# Patient Record
Sex: Female | Born: 1960
Health system: Western US, Academic
[De-identification: ages and names within clinical notes are randomized; demographics above are authoritative.]

---

## 2020-08-22 ENCOUNTER — Ambulatory Visit: Payer: PRIVATE HEALTH INSURANCE

## 2020-08-22 DIAGNOSIS — F3340 Major depressive disorder, recurrent, in remission, unspecified: Secondary | ICD-10-CM

## 2020-08-22 DIAGNOSIS — G8929 Other chronic pain: Secondary | ICD-10-CM

## 2020-08-22 DIAGNOSIS — Z114 Encounter for screening for human immunodeficiency virus [HIV]: Secondary | ICD-10-CM

## 2020-08-22 DIAGNOSIS — L989 Disorder of the skin and subcutaneous tissue, unspecified: Secondary | ICD-10-CM

## 2020-08-22 DIAGNOSIS — Z1159 Encounter for screening for other viral diseases: Secondary | ICD-10-CM

## 2020-08-22 DIAGNOSIS — M5442 Lumbago with sciatica, left side: Secondary | ICD-10-CM

## 2020-08-22 MED ADMIN — LIDOCAINE-EPINEPHRINE 1 %-1:100000 IJ SOLN: 1 mL | INTRADERMAL | @ 18:00:00 | Stop: 2020-08-22 | NDC 63323048217

## 2020-08-22 NOTE — Addendum Note
Addended by: Cindie Laroche on: 08/22/2020 02:32 PM     Modules accepted: Orders

## 2020-08-22 NOTE — Progress Notes
PATIENT: Kelli Watts  MRN: [id number]  DOB: [birth year]  DATE OF SERVICE: 08/22/2020    CHIEF COMPLAINT:   Chief Complaint   Patient presents with   ??? Establish Care        HPI   Kelli Watts is a 59 y.o. female presents for   Chief Complaint   Patient presents with   ??? Establish Care     Several months of low back pain radiating down left leg into foot      MEDS     No outpatient medications have been marked as taking for the 08/22/20 encounter (Office Visit) with Jacqulyn Liner, DO.     Current Facility-Administered Medications for the 08/22/20 encounter (Office Visit) with Jacqulyn Liner, DO   Medication   ??? lidocaine-EPINEPHrine 1 %-1:100000 inj 1 mL       PHYSICAL EXAM      Last Recorded Vital Signs:    08/22/20 0931   BP: 123/76   Pulse: 65   Temp: 36.4 ???C (97.6 ???F)   SpO2: 97%     There is no height or weight on file to calculate BMI.    System Check if normal Positive or additional negative findings   Constit  [x]  General appearance     Eyes  [x]  Conj/Lids [x]  Pupils  [x]  Fundi     [name]  []  External ears/nose []  Otoscopy   [x]  Gross Hearing []  Nasal mucosa   []  Lips/teeth/gums []  Oropharynx    []  mucus membranes []  Head     Neck  []  Inspection/palpation []  Thyroid     Resp  [x]  Effort []  Wheezing    []  Auscultation  []  Crackles     CV  [x]  Rhythm/rate   []  Murmurs   []  LEE   []  JVP non-elevated    Normal pulses:   []  Radial []  Femoral  []  Pedal     Breast  []  Inspection []  Palpation     GI  []  abd masses    []  tenderness   []  rebound/guarding   []  Liver/spleen []  Rectal     GU  M: []  Scrotum []  Penis []  Prostate   F:  []  External []  vaginal wall        []  Cervix  []  mucus        []  Uterus    []  Adnexa      Lymph  []  Neck []  Axillae []  Groin     MSK Specify site examined:    []  Inspect/palp []  ROM   []  Stability [x]  Strength/tone         Skin  []  Inspection []  Palpation  3mm papule left shoulder   Neuro  [x]  CN2-12 intact grossly   [x]  Alert and oriented   []  DTR      [x]  Muscle strength      []  Sensation   [x]  Gait/balance     Psych  [x]  Insight/judgement     [x]  Mood/affect    [x]  Gross cognition        LABS/STUDIES   I have:   []  Reviewed/ordered []  1 []  2 []  ? 3 unique laboratory, radiology, and/or diagnostic tests noted below    []  Reviewed []  1 []  2 []  ? 3 prior external notes and incorporated into patient assessment    []  Discussed management or test interpretation with external provider(s) as noted       Lab Studies:  No results found for any previous visit.  2018 ACC/AHA guidelines recommends that patient is not in statin benefit group. Encourage adherence to heart-healthy lifestyle.    10-Year ASCVD risk cannot be calculated because at least one required variable is not available in [facility] as of 10:08 AM on 08/22/2020.  10-Year ASCVD risk with optimal risk factors is 1.6%.  Values used to calculate ASCVD score:  Age: 59 y.o.   Gender: Female Race: White or Caucasian  Cannot calculate risk because HDL cholesterol not documented within the past 5 years.    Cannot calculate risk because Total cholesterol not documented within the past 5 years.    LDL cholesterol not documented within the past 5 years.    Systolic BP: 123 mm Hg. BP was measured on 08/22/2020.  The patient is not being treated with a medication that influences SBP.  The patient is currently not a smoker.  The patient does not have a diagnosis of diabetes.  Click here for the [facility] ASCVD Cardiovascular Risk Estimator Plus tool Office manager).    Imaging Studies:   No new imaging reviewed today    A&P   Kelli Watts is a 59 y.o. female presenting for   Chief Complaint   Patient presents with   ??? Establish Care         SHAVE BIOPSY PROCEDURE NOTE:    Risk and benefits of Shave Biopsy explained to patient's satisfaction. All questions answered. Informed consent obtained.    Area was cleaned with alcohol. 1 mL of 1% Lidocaine with Epinephrine was injected subcutaneous at biopsy site raising lesion. Dermablade used to shave surface of lesion from left shoulder. Biopsy sent to Pathology. Hemostasis achieved with pressure and Aluminum Hydroxide solution.  Antibiotic ointment and bandage applied. No complications noted.    Patient tolerated procedure well. Wound care instruction provided. Return precautions given. Patient to seek medical attention if signs of infection/pain/concerns. Will call patient with results of Pathology.        PROBLEM & ORDERS    ICD-10-CM    1. Chronic left-sided low back pain with left-sided sciatica  M54.42 XR lumbar spine ap+lat+obl+L5-S1 (5 views)    G89.29 Referral to Primary Care, Sports Medicine   2. Screening for HIV (human immunodeficiency virus)  Z11.4 HIV-1/2 Ag/Ab 4th Generation with Reflex Confirmation   3. Need for hepatitis C screening test  Z11.59 HCV Antibody Screen with Reflex to Quantitative PCR/Genotype   4. MDD (recurrent major depressive disorder) in remission (HCC/RAF)  F33.40 CBC & Auto Differential     Comprehensive Metabolic Panel     Lipid Panel     Sedimentation Rate, Erythrocyte     hsCRP (Cardio CRP) and CV Risk     Vitamin D,25-Hydroxy     TSH with reflex FT4, FT3     Hgb A1c     Urinalysis,Routine     Total Protein/Creat Ratio     Homocysteine, Total   5. Skin lesion  L98.9 Tissue Exam     lidocaine-EPINEPHrine 1 %-1:100000 inj 1 mL       ASSESSMENT      #skin lesion  -suspect SEBORRHEIC KERATOSIS     #LOW BACK PAIN  -PHYSICAL THERAPY  -Xray  -sports med for possible CORTICOSTEROID INJECTION  -follow up OMT    #health maintenance   -schedule shingrix around training schedule     The above recommendation were discussed with the patient.  The patient has all questions answered satisfactorily and is in agreement with this recommended plan of care.  No follow-ups on file.     Author:  Jacqulyn Liner 08/22/2020 10:08 AM

## 2020-08-24 LAB — Tissue Exam

## 2020-09-19 ENCOUNTER — Ambulatory Visit: Payer: BLUE CROSS/BLUE SHIELD

## 2020-10-06 ENCOUNTER — Telehealth: Payer: BLUE CROSS/BLUE SHIELD

## 2020-10-06 NOTE — Telephone Encounter
Called and lvm to advice to get labs done or r/s with PCP, need lab results.

## 2020-10-07 ENCOUNTER — Ambulatory Visit: Payer: PRIVATE HEALTH INSURANCE

## 2020-10-07 ENCOUNTER — Telehealth: Payer: BLUE CROSS/BLUE SHIELD

## 2020-10-07 DIAGNOSIS — G8929 Other chronic pain: Secondary | ICD-10-CM

## 2020-10-07 DIAGNOSIS — M5442 Lumbago with sciatica, left side: Secondary | ICD-10-CM

## 2020-10-07 NOTE — Progress Notes
PATIENT: Kelli Watts  MRN: [id number]  DOB: Feb 16, 1961  DATE OF SERVICE: 10/07/2020    CHIEF COMPLAINT:   Chief Complaint   Patient presents with   ??? Results     lab and xray results    ??? Referral / Auth     physical therapy        HPI   Kelli Watts is a 60 y.o. female presents for   Chief Complaint   Patient presents with   ??? Results     lab and xray results    ??? Referral / Auth     physical therapy         MEDS     No outpatient medications have been marked as taking for the 10/07/20 encounter (Office Visit) with Jacqulyn Liner, DO.       PHYSICAL EXAM      Last Recorded Vital Signs:    10/07/20 1353   BP: 112/72   Pulse: 59   Resp: 18   Temp: 37 ???C (98.6 ???F)   SpO2: 98%     Body mass index is 23.74 kg/m???.    System Check if normal Positive or additional negative findings   Constit  [x]  General appearance     Eyes  [x]  Conj/Lids [x]  Pupils  [x]  Fundi     [name]  []  External ears/nose []  Otoscopy   [x]  Gross Hearing []  Nasal mucosa   []  Lips/teeth/gums []  Oropharynx    []  mucus membranes []  Head     Neck  []  Inspection/palpation []  Thyroid     Resp  [x]  Effort []  Wheezing    []  Auscultation  []  Crackles     CV  [x]  Rhythm/rate   []  Murmurs   []  LEE   []  JVP non-elevated    Normal pulses:   []  Radial []  Femoral  []  Pedal     Breast  []  Inspection []  Palpation     GI  []  abd masses    []  tenderness   []  rebound/guarding   []  Liver/spleen []  Rectal     GU  M: []  Scrotum []  Penis []  Prostate   F:  []  External []  vaginal wall        []  Cervix  []  mucus        []  Uterus    []  Adnexa      Lymph  []  Neck []  Axillae []  Groin     MSK Specify site examined:    []  Inspect/palp []  ROM   []  Stability [x]  Strength/tone         Skin  []  Inspection []  Palpation     Neuro  [x]  CN2-12 intact grossly   [x]  Alert and oriented   []  DTR      [x]  Muscle strength      []  Sensation   [x]  Gait/balance     Psych  [x]  Insight/judgement     [x]  Mood/affect    [x]  Gross cognition        LABS/STUDIES   I have:   []  Reviewed/ordered []  1 []  2 []  ? 3 unique laboratory, radiology, and/or diagnostic tests noted below    []  Reviewed []  1 []  2 []  ? 3 prior external notes and incorporated into patient assessment    []  Discussed management or test interpretation with external provider(s) as noted       Lab Studies:  Orders Only on 09/19/2020   Component Date Value Ref Range Status   ???  Homocysteine, Cardiovascular (Ques* 09/19/2020 8.7  <10.4 umol/L Final    Comment: Homocysteine is increased by functional deficiency of   folate or vitamin B12. Testing for methylmalonic acid   differentiates between these deficiencies. Other causes   of increased homocysteine include renal failure, folate   antagonists such as methotrexate and phenytoin, and   exposure to nitrous oxide.  Jeani Sow, et al., Ann Intern Med. 1999;131(5):331-9.     ??? Creatinine, Random Urine ([facility]) 09/19/2020 55  20 - 275 mg/dL Final   ??? Protein/Creatinine Ratio ([facility]) 09/19/2020 109  21 - 161 mg/g creat Final   ??? Protein/Creatinine Ratio ([facility]) 09/19/2020 0.109  0.021 - 0.161 mg/mg creat Final   ??? Protein, Total, Random Ur ([facility]) 09/19/2020 6  5 - 24 mg/dL Final        Collected 08/22/2020 14:32 ???   Status: Final result ???   Visible to patient: No (not released) ???   Dx: Skin lesion ???   0 Result Notes    Component    CASE REPORT   Surgical Pathology Report ??? ??? ??? ??? ??? ??? ??? ??? ??? ??? ??? ??? Case: [id number] ??? ??? ??? ??? ??? ??? ??? ??? ??? ??? ??? ??? ??? ??? ???   Authorizing Provider: ???Barnetta Chapel, Andrik Sandt, DO ??? ??? ??? ??? ???Collected: ??? ??? ??? ??? ??? 08/22/2020 1432 ??? ??? ??? ??? ??? ???   Ordering Location: ??? ??? [facility] ??? ??? ??? ??? Received: ??? ??? ??? ??? ??? ???08/23/2020 0017 ??? ??? ??? ??? ??? ???   ?????? ??? ??? ??? ??? ??? ??? ??? ??? ??? ??? Medicine Clarene Reamer ??? ??? ??? ??? ??? ??? ??? ??? ??? ??? ??? ??? ??? ??? ??? ??? ??? ??? ??? ??? ??? ??? ??? ??? ??? ??? ??? ??? ???    Pathologist: ??? ??? ??? ??? ??? Sarantopoulos, George P., ??? ??? ??? ??? ??? ??? ??? ??? ??? ??? ??? ??? ??? ??? ??? ??? ??? ??? ??? ??? ??? ??? ??? ??? ???   ?????? ??? ??? ??? ??? ??? ??? ??? ??? ??? ??? MD ??? ??? ??? ??? ??? ??? ??? ??? ??? ??? ??? ??? ??? ??? ??? ??? ??? ??? ??? ??? ??? ??? ??? ??? ??? ??? ??? ??? ??? ??? ??? ??? ??? ??? ??? ???    Specimen: ??? ???Skin, Left shoulder ??? ??? ??? ??? ??? ??? ??? ??? ??? ??? ??? ??? ??? ??? ??? ??? ??? ??? ??? ??? ??? ??? ??? ??? ??? ??? ??? ??? ??? ??? ??? ??? ???      CLINICAL INFORMATION    left shoulder 3mm lesion   FINAL DIAGNOSIS       SKIN, LEFT SHOULDER (SHAVE BIOPSY):  - Seborrheic keratosis, inflamed   - No malignancy in the sections examined             2018 ACC/AHA guidelines recommends that patient is not in statin benefit group. Encourage adherence to heart-healthy lifestyle.    10-Year ASCVD risk cannot be calculated because at least one required variable is not available in [facility] as of 2:28 PM on 10/07/2020.  10-Year ASCVD risk with optimal risk factors is 1.6%.  Values used to calculate ASCVD score:  Age: 60 y.o.   Gender: Female Race: White or Caucasian  Cannot calculate risk because HDL cholesterol not documented within the past 5 years.    Cannot calculate risk because Total cholesterol not documented within the past 5 years.    LDL cholesterol  not documented within the past 5 years.    Systolic BP: 112 mm Hg. BP was measured on 10/07/2020.  The patient is not being treated with a medication that influences SBP.  The patient is currently not a smoker.  The patient does not have a diagnosis of diabetes.  Click here for the [facility] ASCVD Cardiovascular Risk Estimator Plus tool Office manager).    Imaging Studies:       Narrative & Impression   XR LUMBAR SPINE AP LAT OBL 4V  ???  INDICATION: ''chronic low back pain with left sided sciatica''  ???  COMPARISON: None.  ???  ???  IMPRESSION:  ???  There is moderate scoliosis of the mid lumbar spine.  There is no compression fracture or subluxation.  The disc spaces overall well preserved.  There is moderate lower lumbar facet arthropathy.  The sacroiliac joints are unremarkable.  ???  Signed by: Evangeline Gula   08/25/2020 2:56 PM         A&P   Kelli Watts is a 59 y.o. female presenting for   Chief Complaint   Patient presents with   ??? Results     lab and xray results    ??? Referral / Auth     physical therapy         PROBLEM & ORDERS    ICD-10-CM    1. Chronic left-sided low back pain with left-sided sciatica  M54.42 Referral to Rehabilitation, Physical Therapy BPQI (Back Pain)    G89.29 Referral to Medicine, [place]       ASSESSMENT  #hyperlipidemia (elevated cholesterol)   Outside lab work showing LDL in the 130s HDL 65.  Patient concerned because she is exercising with high-intensity and eating very well.  Explained in the context overall good health, isolated LDL moderate elevation is of minimal concern and does not necessitate any further action  ???  #LOW BACK PAIN  -PHYSICAL THERAPY  -Xray  -sports med for possible CORTICOSTEROID INJECTION  -follow up OMT  ???  #skin lesion  -confirmed SEBORRHEIC KERATOSIS     #health maintenance   -schedule shingrix around training schedule     The above recommendation were discussed with the patient.  The patient has all questions answered satisfactorily and is in agreement with this recommended plan of care.    No follow-ups on file.     Author:  Jacqulyn Liner 10/07/2020 2:28 PM

## 2020-10-07 NOTE — Telephone Encounter
Pt is an HMO, Dr. Barnetta Chapel submitted 2 referrals. I advised you would call with further information, Doctor was not specific w/ locations and referral still needs to be processed. Thank you.

## 2020-10-10 ENCOUNTER — Ambulatory Visit: Payer: PRIVATE HEALTH INSURANCE

## 2020-10-10 NOTE — Telephone Encounter
Forwarded by: Ezzard Flax, can you please schedule patient? Thank you

## 2020-10-11 ENCOUNTER — Ambulatory Visit: Payer: PRIVATE HEALTH INSURANCE

## 2020-10-12 ENCOUNTER — Telehealth: Payer: PRIVATE HEALTH INSURANCE

## 2020-10-12 DIAGNOSIS — M5442 Lumbago with sciatica, left side: Secondary | ICD-10-CM

## 2020-10-12 DIAGNOSIS — G8929 Other chronic pain: Secondary | ICD-10-CM

## 2020-10-12 DIAGNOSIS — F419 Anxiety disorder, unspecified: Secondary | ICD-10-CM

## 2020-10-12 DIAGNOSIS — F322 Major depressive disorder, single episode, severe without psychotic features: Secondary | ICD-10-CM

## 2020-10-12 DIAGNOSIS — E782 Mixed hyperlipidemia: Secondary | ICD-10-CM

## 2020-10-12 NOTE — Progress Notes
[facility] PATIENT: Kelli Watts  MRN: [id number]  DOB: Jan 24, 1961  DATE OF SERVICE: 10/12/2020    CHIEF COMPLAINT: No chief complaint on file.       HPI   Kelli Watts is a 60 y.o. female presents for No chief complaint on file.      Severe depression  No SI or plan   Has plan Kelli Watts        MEDS     No outpatient medications have been marked as taking for the 10/12/20 encounter (Telemedicine) with Jacqulyn Liner, DO.       PHYSICAL EXAM    There were no vitals filed for this visit.  There is no height or weight on file to calculate BMI.    System Check if normal Positive or additional negative findings   Constit  [x]  General appearance     Eyes  [x]  Conj/Lids [x]  Pupils  [x]  Fundi     [name]  []  External ears/nose []  Otoscopy   [x]  Gross Hearing []  Nasal mucosa   []  Lips/teeth/gums []  Oropharynx    []  mucus membranes []  Head     Neck  []  Inspection/palpation []  Thyroid     Resp  [x]  Effort []  Wheezing    []  Auscultation  []  Crackles     CV  [x]  Rhythm/rate   []  Murmurs   []  LEE   []  JVP non-elevated    Normal pulses:   []  Radial []  Femoral  []  Pedal     Breast  []  Inspection []  Palpation     GI  []  abd masses    []  tenderness   []  rebound/guarding   []  Liver/spleen []  Rectal     GU  M: []  Scrotum []  Penis []  Prostate   F:  []  External []  vaginal wall        []  Cervix  []  mucus        []  Uterus    []  Adnexa      Lymph  []  Neck []  Axillae []  Groin     MSK Specify site examined:    []  Inspect/palp []  ROM   []  Stability [x]  Strength/tone         Skin  []  Inspection []  Palpation     Neuro  [x]  CN2-12 intact grossly   [x]  Alert and oriented   []  DTR      [x]  Muscle strength      []  Sensation   [x]  Gait/balance     Psych  [x]  Insight/judgement     [x]  Mood/affect    [x]  Gross cognition        LABS/STUDIES   I have:   []  Reviewed/ordered []  1 []  2 []  ? 3 unique laboratory, radiology, and/or diagnostic tests noted below    []  Reviewed []  1 []  2 []  ? 3 prior external notes and incorporated into patient assessment    []  Discussed management or test interpretation with external provider(s) as noted       Lab Studies:  Orders Only on 09/19/2020   Component Date Value Ref Range Status   ??? Homocysteine, Cardiovascular (Ques* 09/19/2020 8.7  <10.4 umol/L Final    Comment: Homocysteine is increased by functional deficiency of   folate or vitamin B12. Testing for methylmalonic acid   differentiates between these deficiencies. Other causes   of increased homocysteine include renal failure, folate   antagonists such as methotrexate and phenytoin, and   exposure to nitrous oxide.  Selhub J, et al.,  Ann Intern Med. 1999;131(5):331-9.     ??? Creatinine, Random Urine ([facility]) 09/19/2020 55  20 - 275 mg/dL Final   ??? Protein/Creatinine Ratio ([facility]) 09/19/2020 109  21 - 161 mg/g creat Final   ??? Protein/Creatinine Ratio ([facility]) 09/19/2020 0.109  0.021 - 0.161 mg/mg creat Final   ??? Protein, Total, Random Ur ([facility]) 09/19/2020 6  5 - 24 mg/dL Final       0981 ACC/AHA guidelines recommends that patient is not in statin benefit group. Encourage adherence to heart-healthy lifestyle.    10-Year ASCVD risk cannot be calculated because at least one required variable is not available in [facility] as of 11:45 AM on 10/12/2020.  10-Year ASCVD risk with optimal risk factors is 1.6%.  Values used to calculate ASCVD score:  Age: 60 y.o.   Gender: Female Race: White or Caucasian  Cannot calculate risk because HDL cholesterol not documented within the past 5 years.    Cannot calculate risk because Total cholesterol not documented within the past 5 years.    LDL cholesterol not documented within the past 5 years.    Systolic BP: 112 mm Hg. BP was measured on 10/07/2020.  The patient is not being treated with a medication that influences SBP.  The patient is currently not a smoker.  The patient does not have a diagnosis of diabetes.  Click here for the [facility] ASCVD Cardiovascular Risk Estimator Plus tool Office manager).    Imaging Studies:   No new imaging reviewed today    A&P   Kelli Watts is a 60 y.o. female presenting for No chief complaint on file.        PROBLEM & ORDERS    ICD-10-CM    1. Anxiety  F41.9 Referral to Psych [facility])   2. Severe depression (HCC/RAF)  F32.2 Referral to Psych [facility])   3. Chronic left-sided low back pain with left-sided sciatica  M54.42     G89.29        ASSESSMENT      #anxiety/depression  -start counseling  -trial 5-htp  -reach out to friend for support  -follow up 4 weeks    #low back pain  -physical therapy referral  Pending  -follow up with me for OMT     #hyperlipidemia (elevated cholesterol)  -justifies further work up including inside tracker      The above recommendation were discussed with the patient.  The patient has all questions answered satisfactorily and is in agreement with this recommended plan of care.    Return in about 4 weeks (around 11/09/2020).     Author:  Jacqulyn Liner 10/12/2020 11:45 AM

## 2020-10-12 NOTE — Progress Notes
Pt scheduled  

## 2020-10-14 NOTE — Telephone Encounter
Hi [facility], can patient PT referral be process so that she can be scheduled? Thank you      Verlon Au

## 2020-10-18 ENCOUNTER — Ambulatory Visit: Payer: BLUE CROSS/BLUE SHIELD

## 2020-10-23 ENCOUNTER — Ambulatory Visit: Payer: PRIVATE HEALTH INSURANCE

## 2020-10-24 DIAGNOSIS — M9900 Segmental and somatic dysfunction of head region: Secondary | ICD-10-CM

## 2020-10-24 DIAGNOSIS — M9902 Segmental and somatic dysfunction of thoracic region: Secondary | ICD-10-CM

## 2020-10-24 DIAGNOSIS — M9907 Segmental and somatic dysfunction of upper extremity: Secondary | ICD-10-CM

## 2020-10-24 DIAGNOSIS — M9908 Segmental and somatic dysfunction of rib cage: Secondary | ICD-10-CM

## 2020-10-24 DIAGNOSIS — F322 Major depressive disorder, single episode, severe without psychotic features: Secondary | ICD-10-CM

## 2020-10-24 DIAGNOSIS — M9905 Segmental and somatic dysfunction of pelvic region: Secondary | ICD-10-CM

## 2020-10-24 DIAGNOSIS — M9906 Segmental and somatic dysfunction of lower extremity: Secondary | ICD-10-CM

## 2020-10-24 DIAGNOSIS — M9901 Segmental and somatic dysfunction of cervical region: Secondary | ICD-10-CM

## 2020-10-24 DIAGNOSIS — M9904 Segmental and somatic dysfunction of sacral region: Secondary | ICD-10-CM

## 2020-10-24 NOTE — Progress Notes
PATIENT: Kelli Watts  MRN: [id number]  DOB: [birth year]  DATE OF SERVICE: 10/18/2020    CHIEF COMPLAINT: No chief complaint on file.       HPI   Kelli Watts is a 60 y.o. female presents for No chief complaint on file.      MEDS     No outpatient medications have been marked as taking for the 10/18/20 encounter (Office Visit) with Jacqulyn Liner, DO.       PHYSICAL EXAM      Last Recorded Vital Signs:    10/18/20 1431   BP: 125/75   Pulse: 55   Resp: 16   Temp: 36.9 ???C (98.4 ???F)   SpO2: 98%     Body mass index is 23.67 kg/m???.    System Check if normal Positive or additional negative findings   Constit  [x]  General appearance     Eyes  [x]  Conj/Lids [x]  Pupils  [x]  Fundi     [name]  []  External ears/nose []  Otoscopy   [x]  Gross Hearing []  Nasal mucosa   []  Lips/teeth/gums []  Oropharynx    []  mucus membranes []  Head     Neck  []  Inspection/palpation []  Thyroid     Resp  [x]  Effort []  Wheezing    []  Auscultation  []  Crackles     CV  [x]  Rhythm/rate   []  Murmurs   []  LEE   []  JVP non-elevated    Normal pulses:   []  Radial []  Femoral  []  Pedal     Breast  []  Inspection []  Palpation     GI  []  abd masses    []  tenderness   []  rebound/guarding   []  Liver/spleen []  Rectal     GU  M: []  Scrotum []  Penis []  Prostate   F:  []  External []  vaginal wall        []  Cervix  []  mucus        []  Uterus    []  Adnexa      Lymph  []  Neck []  Axillae []  Groin     MSK Specify site examined:    []  Inspect/palp []  ROM   []  Stability [x]  Strength/tone         Skin  []  Inspection []  Palpation     Neuro  [x]  CN2-12 intact grossly   [x]  Alert and oriented   []  DTR      [x]  Muscle strength      []  Sensation   [x]  Gait/balance     Psych  [x]  Insight/judgement     [x]  Mood/affect    [x]  Gross cognition        LABS/STUDIES   I have:   []  Reviewed/ordered []  1 []  2 []  ? 3 unique laboratory, radiology, and/or diagnostic tests noted below    []  Reviewed []  1 []  2 []  ? 3 prior external notes and incorporated into patient assessment    []  Discussed management or test interpretation with external provider(s) as noted       Lab Studies:  Orders Only on 09/19/2020   Component Date Value Ref Range Status   ??? Homocysteine, Cardiovascular (Ques* 09/19/2020 8.7  <10.4 umol/L Final    Comment: Homocysteine is increased by functional deficiency of   folate or vitamin B12. Testing for methylmalonic acid   differentiates between these deficiencies. Other causes   of increased homocysteine include renal failure, folate   antagonists such as methotrexate and phenytoin, and   exposure to nitrous oxide.  Jeani Sow, et al., Ann Intern Med. 1999;131(5):331-9.     ??? Creatinine, Random Urine ([facility]) 09/19/2020 55  20 - 275 mg/dL Final   ??? Protein/Creatinine Ratio ([facility]) 09/19/2020 109  21 - 161 mg/g creat Final   ??? Protein/Creatinine Ratio ([facility]) 09/19/2020 0.109  0.021 - 0.161 mg/mg creat Final   ??? Protein, Total, Random Ur ([facility]) 09/19/2020 6  5 - 24 mg/dL Final       1610 ACC/AHA guidelines recommends that patient is not in statin benefit group. Encourage adherence to heart-healthy lifestyle.    10-Year ASCVD risk cannot be calculated because at least one required variable is not available in [facility] as of 10:53 AM on 10/24/2020.  10-Year ASCVD risk with optimal risk factors is 1.6%.  Values used to calculate ASCVD score:  Age: 60 y.o.   Gender: Female Race: White or Caucasian  Cannot calculate risk because HDL cholesterol not documented within the past 5 years.    Cannot calculate risk because Total cholesterol not documented within the past 5 years.    LDL cholesterol not documented within the past 5 years.    Systolic BP: 125 mm Hg. BP was measured on 10/18/2020.  The patient is not being treated with a medication that influences SBP.  The patient is currently not a smoker.  The patient does not have a diagnosis of diabetes.  Click here for the [facility] ASCVD Cardiovascular Risk Estimator Plus tool Office manager).    Imaging Studies:   No new imaging reviewed today    A&P   Kelli Watts is a 60 y.o. female presenting for No chief complaint on file.      Osteopathic Exam  Cranium:  SBS Compresion     Cervical:   Hypertonicity and Tenderness   Thoracic: Bogginess and Roppiness   Lumbar: Hypertonicity and Tenderness  with Asymetry of paravetebral muscles  Sacrum: SI in Extension   Pelvis: ant Rotated Pelvis, Pubic dysfunction  Upper Extremity: Shoulder elevated/depressed   Lower Extremity: Restriction in hip flexion/external roation   Abdomen: Restriction in thoracic diaphragm   Rib Cage:  Inflare    Gait: normal  Muscle Strength/Tone: Normal        Osteopathic Treatment  Cranium: Condylar Decompresion, CV4  Cervical:  Suboccipital Release, Long Axis Kneeding  Thoracic: Scapular Release, Prone Pressure  Lumbar: Prone Pressure with Counterleverage, BLT  Sacral: LS Decompression, SI Decompression  Pelvis:  Stills Technique, PS Decompression  Upper Extremity: MFR of shoulder and arm  Lower Extremity: Stills Technique, MFR of hip and knee  Rib: Muscle Energy, Thoracic Diaphragm Release     Following OMT patient reports improved symptoms and reported decrease in tenderness and increase in ROM      PROBLEM & ORDERS    ICD-10-CM    1. Somatic dysfunction of head region  M99.00    2. Somatic dysfunction of cervical region  M99.01    3. Segmental and somatic dysfunction of thoracic region  M99.02    4. Somatic dysfunction of lumbar region  M99.03    5. Somatic dysfunction of spine, sacral  M99.04    6. Pelvic region somatic dysfunction  M99.05    7. Lower limb region somatic dysfunction  M99.06    8. Segmental dysfunction of upper extremity  M99.07    9. Somatic dysfunction of rib  M99.08    10. Severe depression (HCC/RAF)  F32.2        ASSESSMENT    -monitoring depression, to start therapy consider  SSRI     The above recommendation were discussed with the patient.  The patient has all questions answered satisfactorily and is in agreement with this recommended plan of care.    No follow-ups on file.     Author:  Jacqulyn Liner 10/24/2020 10:53 AM

## 2021-01-27 ENCOUNTER — Ambulatory Visit: Payer: PRIVATE HEALTH INSURANCE

## 2021-01-27 NOTE — Consults
[facility] New Patient Note    PATIENT: Kelli Watts  MRN: [id number]  DOB: 11-01-1960  DATE OF SERVICE: 01/27/2021    REFERRING PHYSICIAN: Jacqulyn Liner, DO  PRIMARY CARE PHYSICIAN: Jacqulyn Liner, DO    Chief Complaint   Patient presents with   ??? New Consult       Subjective:      History of Present Illness:  Kelli Watts is a 60 y.o. female with history of depression, anxiety and low back pain. Referred by Dr. Jacqulyn Liner, Family Medicine, for low back pain.       Here for consult only, no treatment today.    She originally scheduled appt for LBP, severe depression and hot flashes. Currently, not symptomatic. Intermittent intermittent L sciatic pain down L posterior leg. 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 Worse with driving. Does not bother her while running. Seeing Dr. Baird Lyons for acupuncture and herbs in [place]. Depression has improved. Working with a LCSW. Hot flashes have resolved.     Has twinges of R knee pain while running      Diet: No red meat; weighs and tracks everything; 60% carbs, 30% protein, 10% fats; pasta, quinoa, brown rice, salmon, greek yogurt, no sugar; GU & Kind bars while running  Fluids: 1 gallon water daily; Nuun hydration; ginger & oolong tea; coffee  Sleep: 8 hours; sleeps 5; wakes a lot; light sleep; tracked with Aura ring  BMs: Daily  Exercise: Runs marathons, Iron[employer], [employer], yoga, Pilates  Relaxation: Meditation  Supplements: Magnesium, turmeric, iron, B12, 5-HTP, Chinese herbal formulas      Pain Location: Lower, Back, Right, Knee, Left, Leg    No outpatient medications have been marked as taking for the 01/27/21 encounter (Office Visit) with Alyson Ingles, LAC.       No Known Allergies    No past medical history on file.    No past surgical history on file.    Social History     Socioeconomic History   ??? Marital status: Single   Tobacco Use   ??? Smoking status: Never Smoker   ??? Smokeless tobacco: Never Used       Family History   Problem Relation Age of Onset   ??? No Known Problems Mother    ??? No Known Problems Father    ??? Breast cancer Maternal Grandfather    ??? Stroke Maternal Grandfather          Review of systems:     A 14 point review of systems was completed on intake.  Pertinent positives and negatives are in HPI and/or summarized below.    Objective:     Last Recorded Vital Signs:    01/27/21 1317   BP: 110/60   Pulse: 56       Watts: alert, appears stated age and cooperative  Eyes: conjunctiva and lids normal  ENMT: normal external nose and ears  Lungs:normal respiratory effort.  no cyanosis.  Abdomen: soft, non-tender  Extremities: no cyanosis or edema  Skin: no rashes.  Neuro: no focal motor deficits.  Psych: alert and oriented to person, place and time    Trigger/ Tender Points Identification: (see annotated picture)  .    Labs/Imaging:    08/24/20  XR LUMBAR SPINE AP LAT OBL 4V  ???  INDICATION: ''chronic low back pain with left sided sciatica''  ???  COMPARISON: None.  ???  ???  IMPRESSION:  ???  There is moderate scoliosis of the mid lumbar spine.  There is no compression fracture or subluxation.  The disc spaces overall well preserved.  There is moderate lower lumbar facet arthropathy.  The sacroiliac joints are unremarkable.    Assessment/Impression:     No diagnosis found.      Plan/ Recommendation/Education:      Plan:         There are no Patient Instructions on file for this visit.      No follow-ups on file.      Author: Hassan Rowan, LAc 01/27/2021 1:47 PM

## 2021-01-31 DIAGNOSIS — F3340 Major depressive disorder, recurrent, in remission, unspecified: Secondary | ICD-10-CM

## 2021-01-31 DIAGNOSIS — G8929 Other chronic pain: Secondary | ICD-10-CM

## 2021-01-31 DIAGNOSIS — N951 Menopausal and female climacteric states: Secondary | ICD-10-CM

## 2021-01-31 DIAGNOSIS — M5442 Lumbago with sciatica, left side: Secondary | ICD-10-CM

## 2021-03-08 ENCOUNTER — Ambulatory Visit: Payer: BLUE CROSS/BLUE SHIELD

## 2021-03-08 DIAGNOSIS — L820 Inflamed seborrheic keratosis: Secondary | ICD-10-CM

## 2021-03-08 MED ADMIN — LIDOCAINE-EPINEPHRINE 1 %-1:100000 IJ SOLN: 2 mL | INTRADERMAL | @ 18:00:00 | Stop: 2021-03-08 | NDC 63323048217

## 2021-03-08 NOTE — Progress Notes
PATIENT: Kelli Watts  MRN: [id number]  DOB: [birth year]  DATE OF SERVICE: 03/08/2021    CHIEF COMPLAINT:   Chief Complaint   Patient presents with   ? Nevus     Mole on back        HPI   Kelli Watts is a 60 y.o. female presents for   Chief Complaint   Patient presents with   ? Nevus     Mole on back       Bothersome wants removed    MEDS     No outpatient medications have been marked as taking for the 03/08/21 encounter (Office Visit) with Jacqulyn Liner, DO.     Current Facility-Administered Medications for the 03/08/21 encounter (Office Visit) with Jacqulyn Liner, DO   Medication   ? lidocaine-EPINEPHrine 1 %-1:100000 inj 2 mL       PHYSICAL EXAM      Last Recorded Vital Signs:    03/08/21 1034   BP: 116/79   Pulse: 59   Resp: 16   Temp: 36.8 ?C (98.2 ?F)   SpO2: 99%     Body mass index is 23.03 kg/m?Marland Kitchen    System Check if normal Positive or additional negative findings   Constit  [x]  General appearance     Eyes  [x]  Conj/Lids [x]  Pupils  [x]  Fundi     [name]  []  External ears/nose []  Otoscopy   [x]  Gross Hearing []  Nasal mucosa   []  Lips/teeth/gums []  Oropharynx    []  mucus membranes []  Head     Neck  []  Inspection/palpation []  Thyroid     Resp  [x]  Effort []  Wheezing    []  Auscultation  []  Crackles     CV  [x]  Rhythm/rate   []  Murmurs   []  LEE   []  JVP non-elevated    Normal pulses:   []  Radial []  Femoral  []  Pedal     Breast  []  Inspection []  Palpation     GI  []  abd masses    []  tenderness   []  rebound/guarding   []  Liver/spleen []  Rectal     GU  M: []  Scrotum []  Penis []  Prostate   F:  []  External []  vaginal wall        []  Cervix  []  mucus        []  Uterus    []  Adnexa      Lymph  []  Neck []  Axillae []  Groin     MSK Specify site examined:    []  Inspect/palp []  ROM   []  Stability [x]  Strength/tone         Skin  []  Inspection []  Palpation  2cm SEBORRHEIC KERATOSIS on back    Neuro  [x]  CN2-12 intact grossly   [x]  Alert and oriented   []  DTR      [x]  Muscle strength      []  Sensation   [x]  Gait/balance     Psych [x]  Insight/judgement     [x]  Mood/affect    [x]  Gross cognition        LABS/STUDIES   I have:   []  Reviewed/ordered []  1 []  2 []  ? 3 unique laboratory, radiology, and/or diagnostic tests noted below    []  Reviewed []  1 []  2 []  ? 3 prior external notes and incorporated into patient assessment    []  Discussed management or test interpretation with external provider(s) as noted       Lab Studies:  Orders Only on 09/19/2020  Component Date Value Ref Range Status   ? Homocysteine, Cardiovascular (Ques* 09/19/2020 8.7  <10.4 umol/L Final    Comment: Homocysteine is increased by functional deficiency of   folate or vitamin B12. Testing for methylmalonic acid   differentiates between these deficiencies. Other causes   of increased homocysteine include renal failure, folate   antagonists such as methotrexate and phenytoin, and   exposure to nitrous oxide.  Kelli Watts, et al., Ann Intern Med. 1999;131(5):331-9.     ? Creatinine, Random Urine ([facility]) 09/19/2020 55  20 - 275 mg/dL Final   ? Protein/Creatinine Ratio ([facility]) 09/19/2020 109  21 - 161 mg/g creat Final   ? Protein/Creatinine Ratio ([facility]) 09/19/2020 0.109  0.021 - 0.161 mg/mg creat Final   ? Protein, Total, Random Ur ([facility]) 09/19/2020 6  5 - 24 mg/dL Final       1610 ACC/AHA guidelines recommends that patient is not in statin benefit group. Encourage adherence to heart-healthy lifestyle.    10-Year ASCVD risk cannot be calculated because at least one required variable is not available in [facility] as of 11:05 AM on 03/08/2021.  10-Year ASCVD risk with optimal risk factors is 1.9%.  Values used to calculate ASCVD score:  Age: 60 y.o.   Gender: Female Race: White: Not Listed  Cannot calculate risk because HDL cholesterol not documented within the past 5 years.    Cannot calculate risk because Total cholesterol not documented within the past 5 years.    LDL cholesterol not documented within the past 5 years.    Systolic BP: 116 mm Hg. BP was measured on 03/08/2021.  The patient is not being treated with a medication that influences SBP.  The patient is currently not a smoker.  The patient does not have a diagnosis of diabetes.  Click here for the [facility] ASCVD Cardiovascular Risk Estimator Plus tool Office manager).    Imaging Studies:   No new imaging reviewed today    A&P   Kelli Watts is a 60 y.o. female presenting for   Chief Complaint   Patient presents with   ? Nevus     Mole on back         SHAVE BIOPSY PROCEDURE NOTE:    Risk and benefits of Shave Biopsy explained to patient's satisfaction. All questions answered. Informed consent obtained.    Area was cleaned with alcohol. 2 mL of 1% Lidocaine with Epinephrine was injected subcutaneous at biopsy site raising lesion. Dermablade used to shave surface of lesion from back. Biopsy sent to Pathology. Hemostasis achieved with pressure and Aluminum Hydroxide solution.  Antibiotic ointment and bandage applied. No complications noted.    Patient tolerated procedure well. Wound care instruction provided. Return precautions given. Patient to seek medical attention if signs of infection/pain/concerns. Will call patient with results of Pathology.        PROBLEM & ORDERS    ICD-10-CM    1. Inflamed seborrheic keratosis  L82.0 Tissue Exam     lidocaine-EPINEPHrine 1 %-1:100000 inj 2 mL       ASSESSMENT       The above recommendation were discussed with the patient.  The patient has all questions answered satisfactorily and is in agreement with this recommended plan of care.    No follow-ups on file.     Author:  Jacqulyn Liner 03/08/2021 11:05 AM

## 2021-03-09 LAB — Tissue Exam

## 2022-09-19 ENCOUNTER — Ambulatory Visit: Payer: BLUE CROSS/BLUE SHIELD

## 2022-09-19 DIAGNOSIS — Z1231 Encounter for screening mammogram for malignant neoplasm of breast: Secondary | ICD-10-CM

## 2024-04-18 IMAGING — MR RM - [facility]
4 of 5 series · 19 of 40 positions shown · non-contrast
Comparison: none

[Series 3: T1 · sagittal · 3.5mm · 0.31mm/px · 3 of 26 slices shown]
[im 4/26]
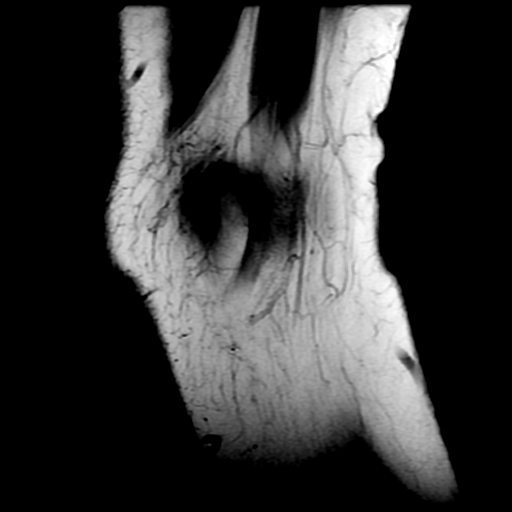
[im 15/26]
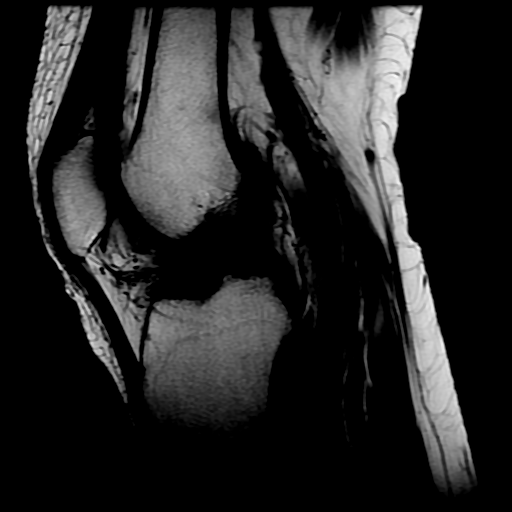
[im 22/26]
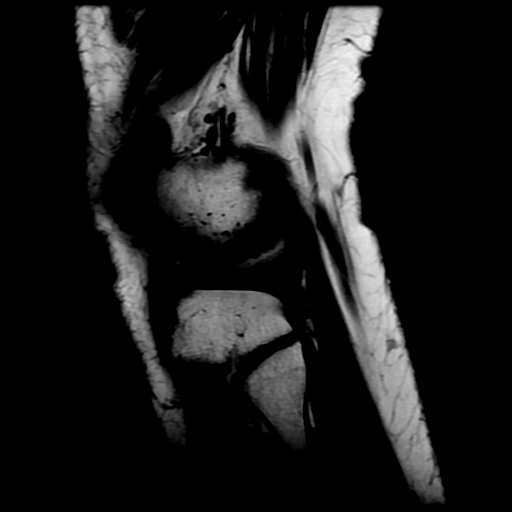

[Series 4: sag dp fs · sagittal · 3.5mm · 0.31mm/px · 3 of 26 slices shown]
[im 4/26]
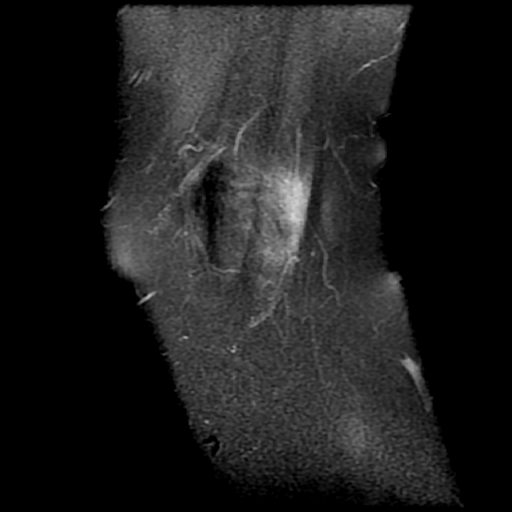
[im 13/26]
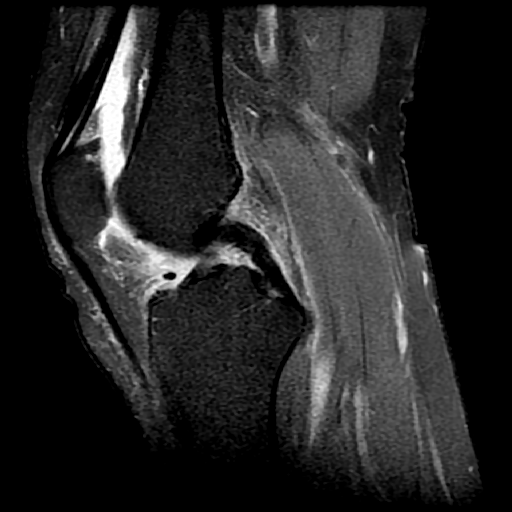
[im 22/26]
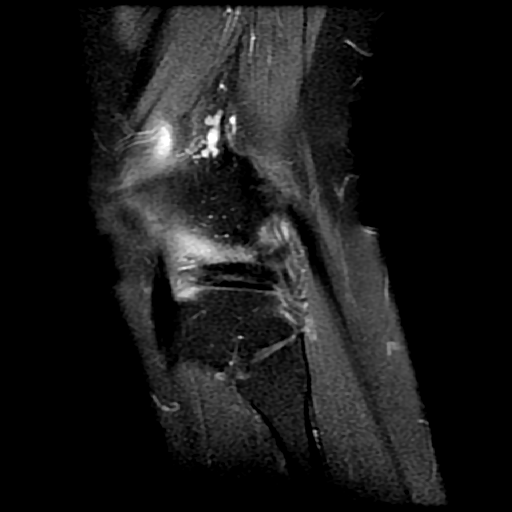

[Series 5: cor dp fs · coronal · 3.5mm · 0.33mm/px · 3 of 25 slices shown]
[im 4/25]
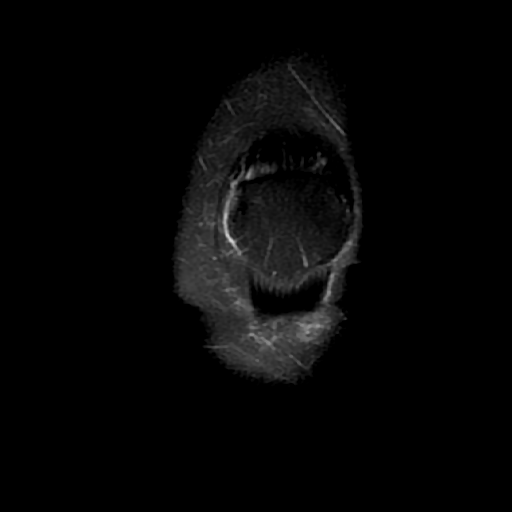
[im 14/25]
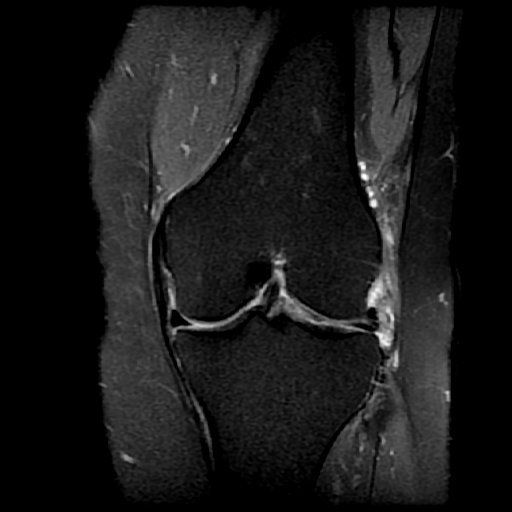
[im 21/25]
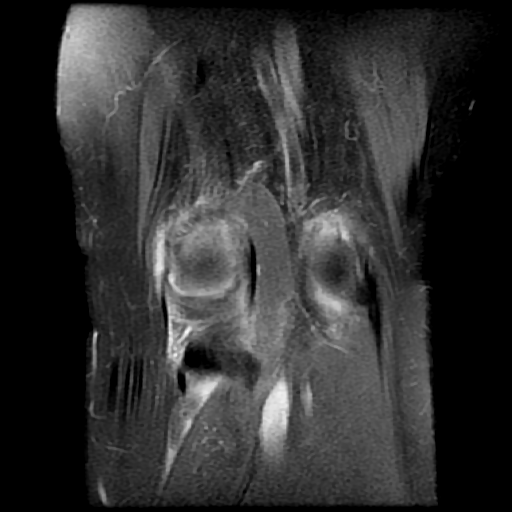

[Series 6: PD fat-sat · axial · 3.5mm · 0.33mm/px · z∈[-83,+24]mm · 10 of 32 slices shown]
[im 1/32]
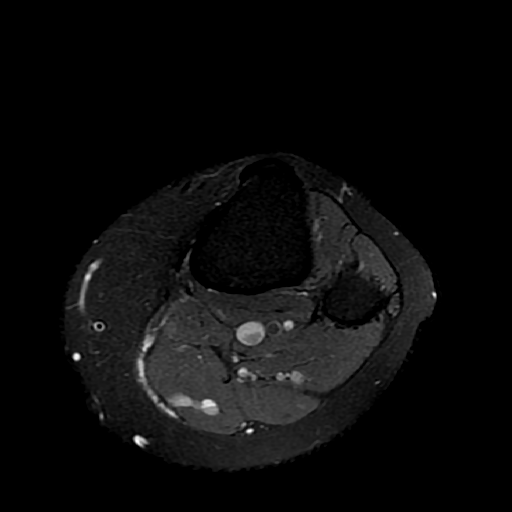
[im 4/32]
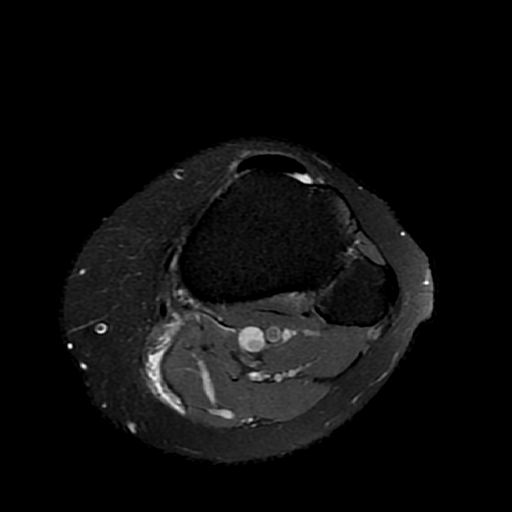
[im 7/32]
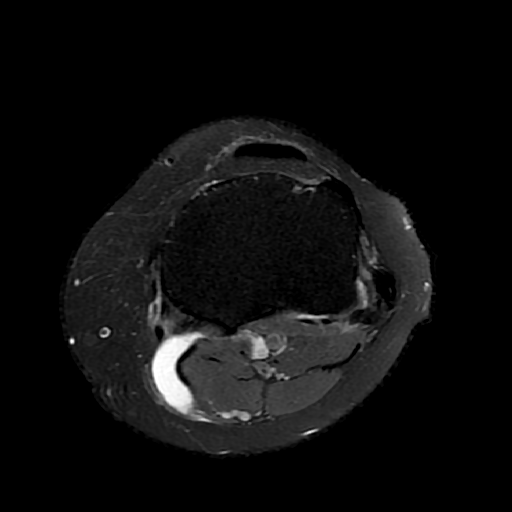
[im 10/32]
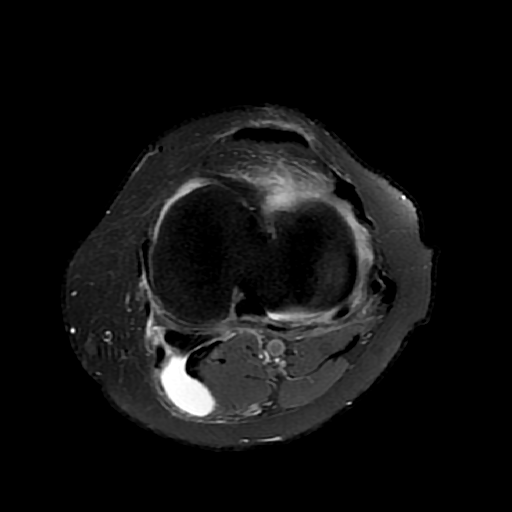
[im 13/32]
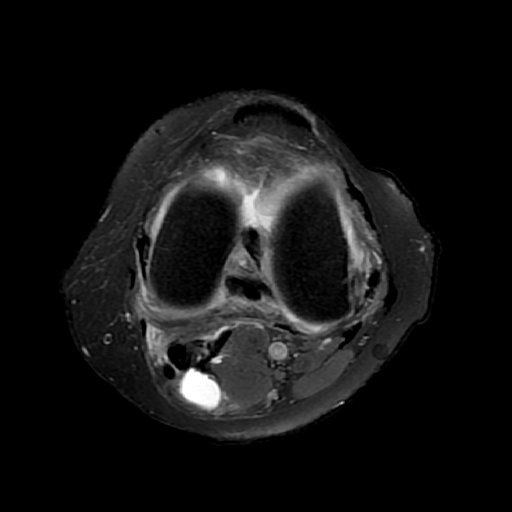
[im 16/32]
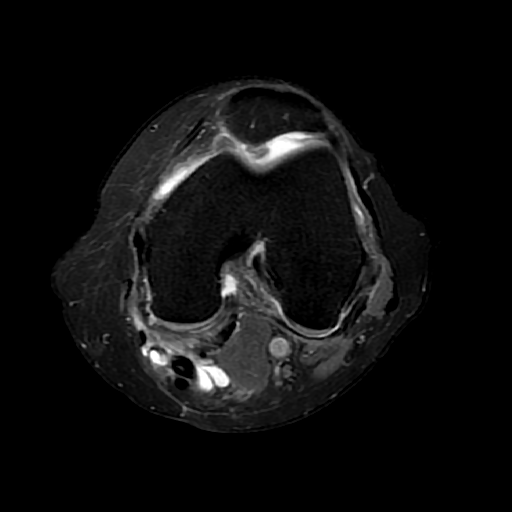
[im 19/32]
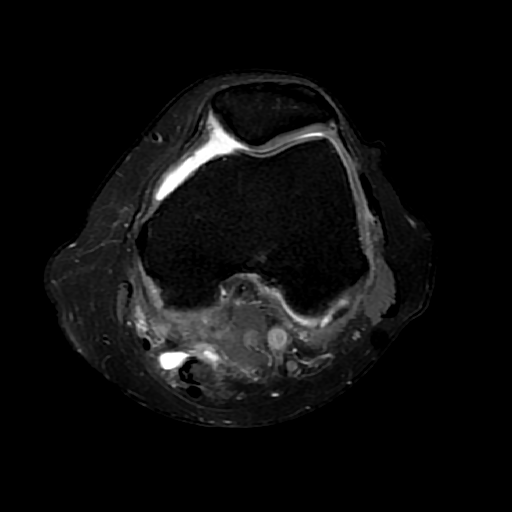
[im 22/32]
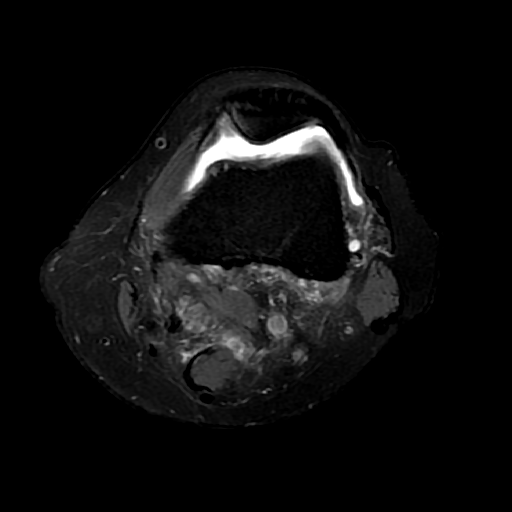
[im 25/32]
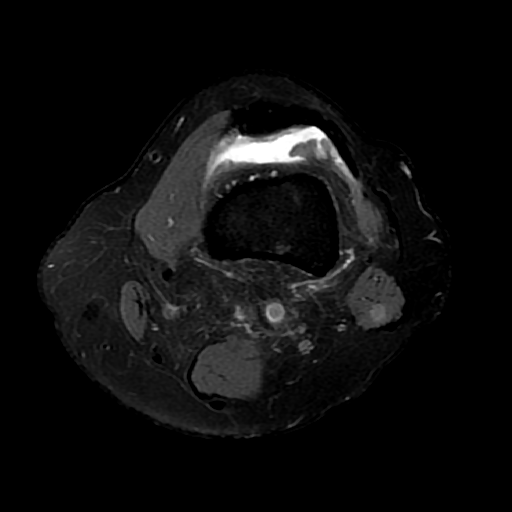
[im 28/32]
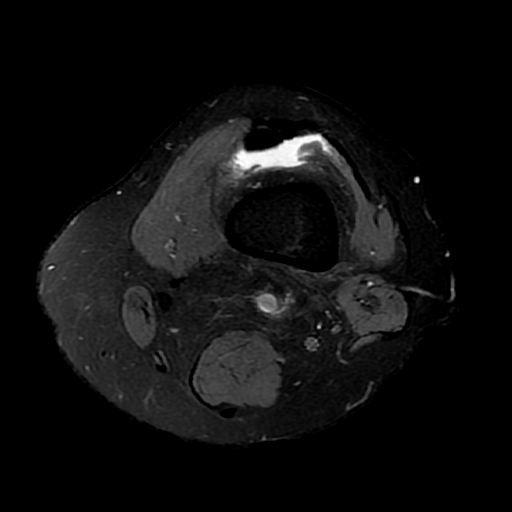

[19 of 40 positions shown; findings below may reference images not displayed]

Médico: -

Técnica: realizadas com sequências spin-echo e fast spin-echo, com aquisições multiplanares ponderadas em T1, T2 e
densidade de prótons, sem contraste.

Análise:
Estruturas ósseas com morfologia e sinal medular conservados.
Erosão  condral  no  terço  superior  da  faceta  medial  da  patela,  medindo  0,6  x  0,5  cm,  com  discreto  edema  ósseo
RESSONÂNCIA MAGNÉTICA DO [facility] ESQUERDO
subcondral.
Rotura horizontal/oblíqua no corpo/corno anterior do menisco lateral. Menisco medial preservado.
Ligamentos  cruzados  e  colateral  medial  preservados.  Espessamento  e  abaulamento  do  ligamento  colateral  lateral
indicando injúria crônica ou lesão pregressa.
Tendões quadríceps e patelar sem alterações signiﬁcativas.
Tendinopatia e peritendinite na origem da cabeça medial do gastrocnêmio.
Peritendinite da pata anserina.
Moderado derrame articular.
Cisto poplíteo (Baker), com irregularidade dos contornos junto à margem inferior indicando lesão parcial do mesmo,
medindo 6 x 1,1 cm.
Feixes neurovasculares sem alterações ao método.

Impressão diagnóstica:
Erosão condral no terço superior da faceta medial da patela, com discreto edema ósseo subcondral.
Rotura horizontal/oblíqua no corpo/corno anterior do menisco lateral.
Injúria crônica ou lesão pregressa do ligamento colateral lateral.
Tendinopatia e peritendinite na origem da cabeça medial do gastrocnêmio.
[facility] - [address] [place] [id number], [place] - [place]
Médico: -
Peritendinite da pata anserina.
Moderado derrame articular.
Cisto poplíteo (Baker), com irregularidade dos contornos junto à margem inferior indicando lesão parcial do mesmo.
[facility] - [address], [place] [id number], [place] - [place]

## 2024-04-18 IMAGING — MR RM - [facility]
4 of 5 series · 18 of 40 positions shown · non-contrast
Comparison: none

[Series 3: T1 · sagittal · 3.5mm · 0.31mm/px · 3 of 26 slices shown]
[im 4/26]
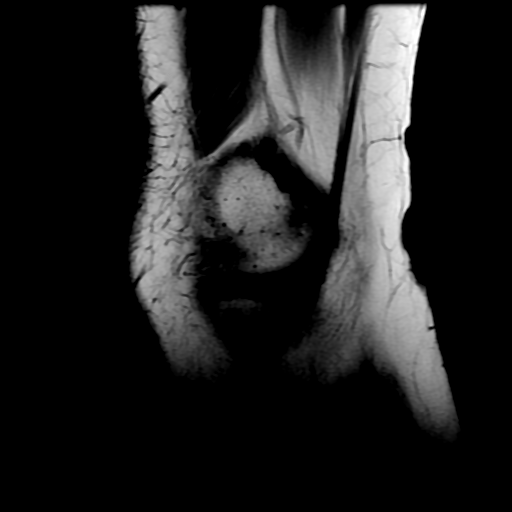
[im 15/26]
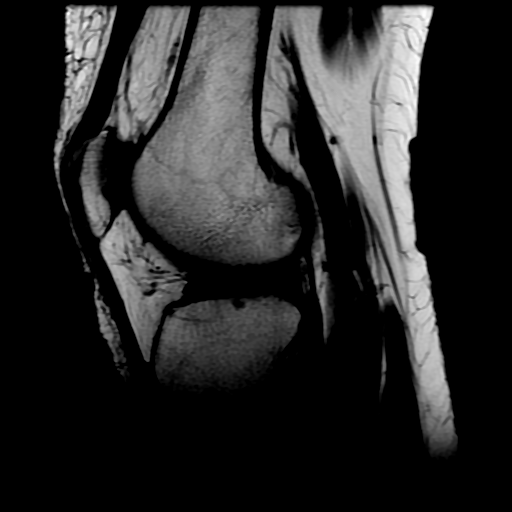
[im 22/26]
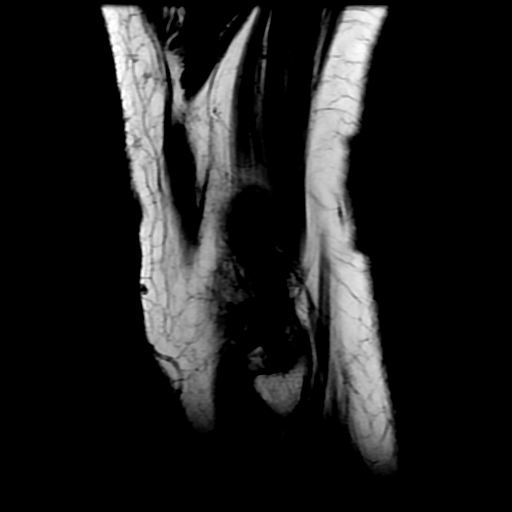

[Series 4: sag dp fs · sagittal · 3.5mm · 0.31mm/px · 3 of 26 slices shown]
[im 4/26]
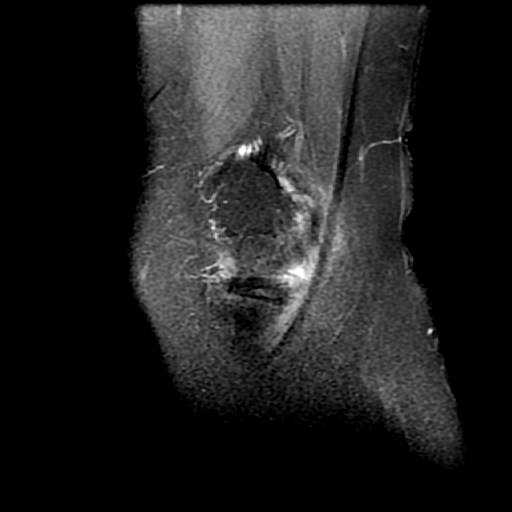
[im 13/26]
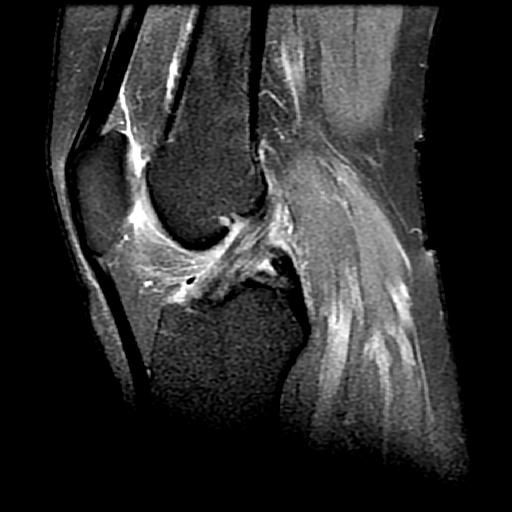
[im 22/26]
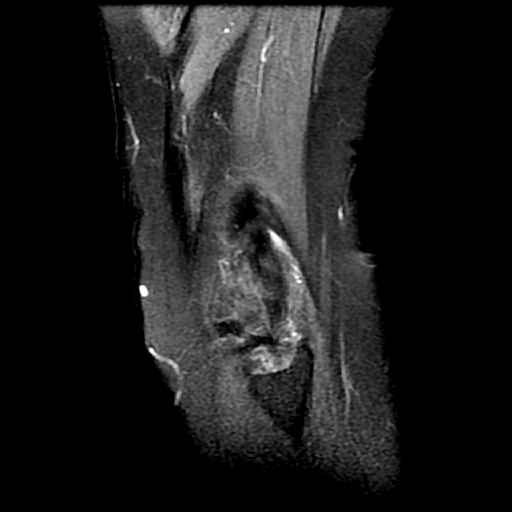

[Series 5: cor dp fs · coronal · 3.5mm · 0.31mm/px · 3 of 25 slices shown]
[im 4/25]
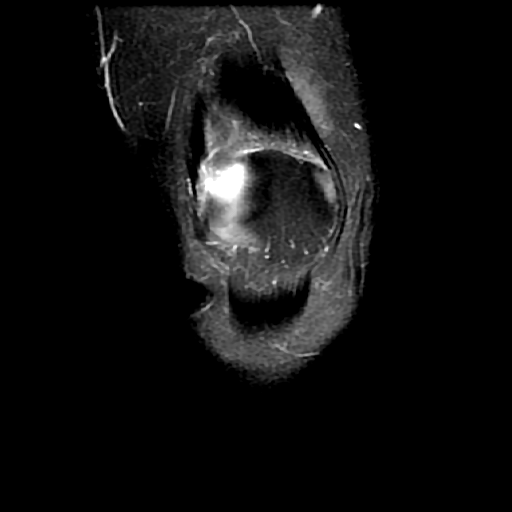
[im 14/25]
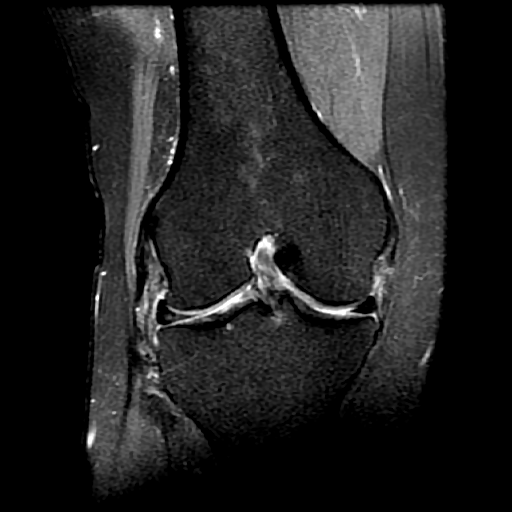
[im 21/25]
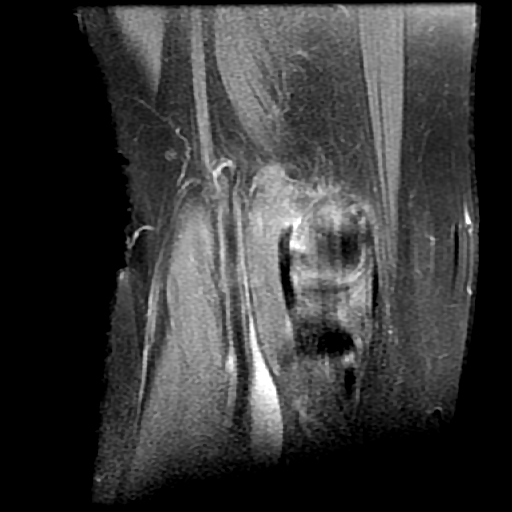

[Series 6: PD fat-sat · axial · 3.5mm · 0.31mm/px · z∈[-82,+26]mm · 9 of 32 slices shown]
[im 1/32]
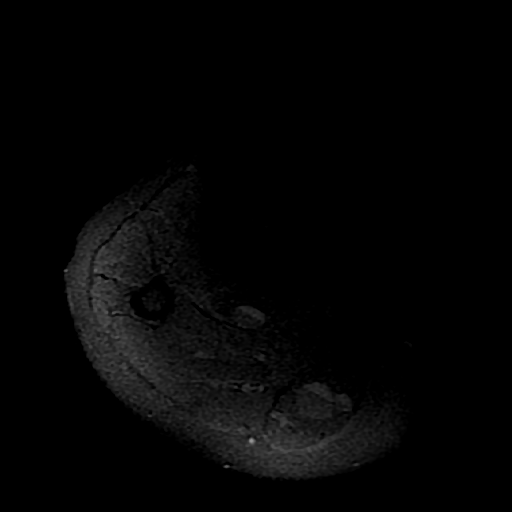
[im 4/32]
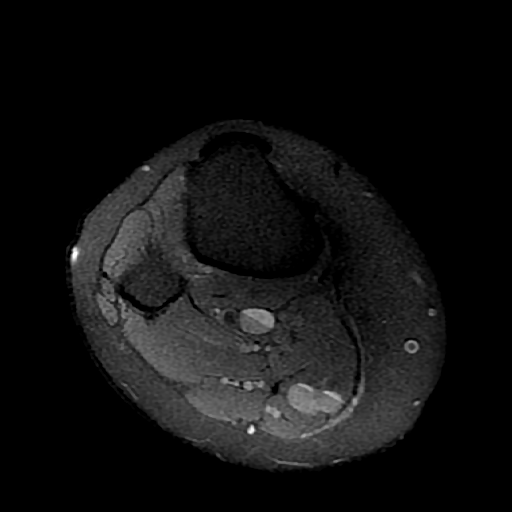
[im 7/32]
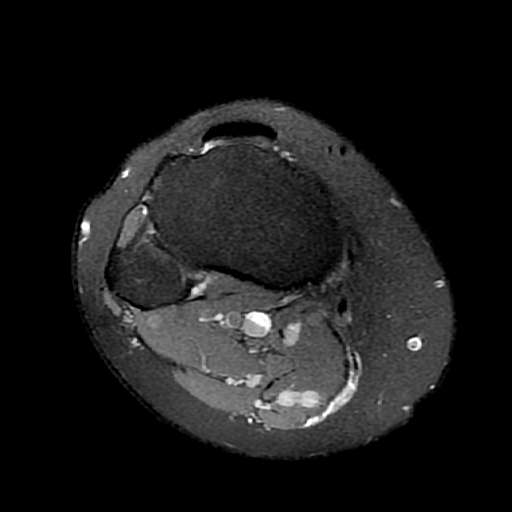
[im 10/32]
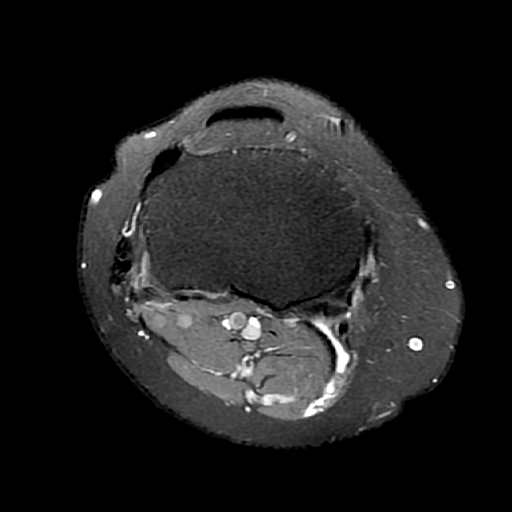
[im 13/32]
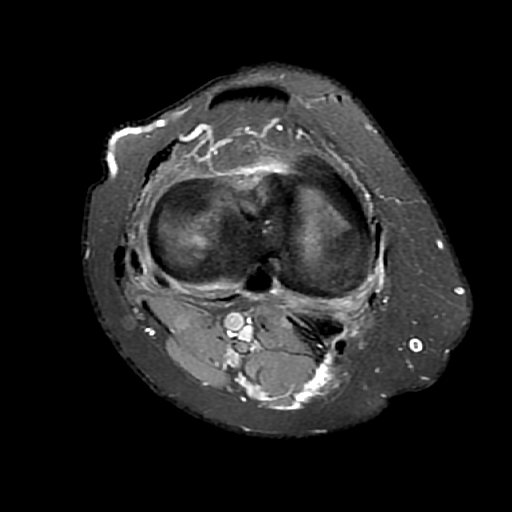
[im 16/32]
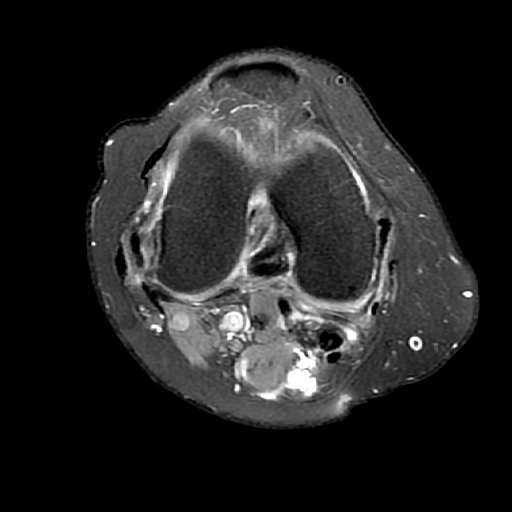
[im 19/32]
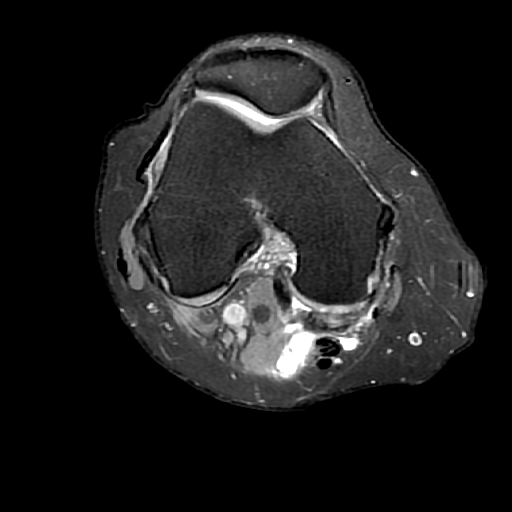
[im 22/32]
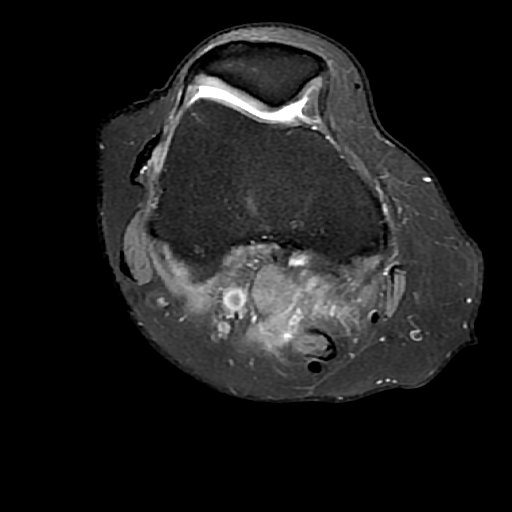
[im 28/32]
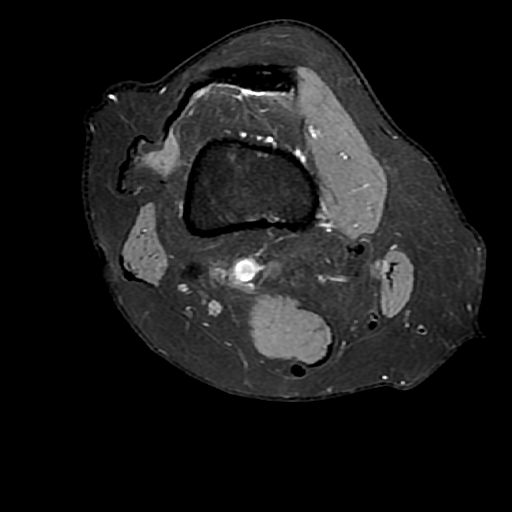

[18 of 40 positions shown; findings below may reference images not displayed]

Médico: -
Técnica: realizadas com sequências spin-echo e fast spin-echo, com aquisições multiplanares ponderadas em T1, T2 e
densidade de prótons, sem contraste e com supressão de gordura.
Indicação clínica: Dor persistente.
Análise:
Estruturas ósseas com morfologia e sinal medular conservados.
Aﬁlamento condral na faceta medial da patela, principalmente no terço superior, sem exposição óssea e sem edema
ósseo subcondral.
Rotura horizontal/oblíqua no corpo/corno posterior do menisco medial. Alteração de sinal intrassubstancial e discreta
RESSONÂNCIA MAGNÉTICA DO JOEL[facility] DIREITO
irregularidade dos contornos no corpo do menisco lateral.
Alteração  difusa  do  sinal  do  ligamento  cruzado  anterior,  indicando  degeneração  mucinoide.  Ligamento  cruzado
posterior preservado.
Espessamento  e  alteração  de  sinal  indicando  injúria  crônica  ou  lesão  pregressa  cicatrizada  de  ﬁAllany  proximais  do
ligamento colateral lateral. Ligamento colateral medial preservado.
Tendões quadríceps e patelar sem alterações signiﬁcativas.
Tendinopatia e peritendinite na origem da cabeça medial do gastrocnêmio.
Ausência de derrame articular signiﬁcativo.
Cisto poplíteo alongado com sinais de rotura parcial, notando-se edema dos planos adjacentes ao contorno inferior
sugerindo a possível lesão parcial, medindo 4,6 x 1 cm.
Feixes neurovasculares sem alterações ao método.

Impressão diagnóstica:
Aﬁlamento condral na faceta medial da patela, principalmente no terço superior, sem exposição óssea e sem edema
ósseo subcondral.
Rotura horizontal/oblíqua no corpo/corno posterior do menisco medial.
Alteração de sinal intrassubstancial e discreta irregularidade dos contornos no corpo do menisco lateral.
[facility] - [address], [place] [id number], [place] - [place]
Médico: -
Degeneração mucinoide inicial do ligamento cruzado anterior.
Injúria crônica ou lesão pregressa cicatrizada de ﬁAllany proximais do ligamento colateral lateral.
Tendinopatia e peritendinite na origem da cabeça medial do gastrocnêmio.
Cisto poplíteo alongado com sinais de rotura parcial.
[facility] - [address], [place] [id number], [place] - [place]
